# Patient Record
Sex: Female | Born: 1940 | Race: Black or African American | Hispanic: No | Marital: Single | Smoking: Never smoker
Health system: Southern US, Community
[De-identification: ages and names within clinical notes are randomized; demographics above are authoritative.]

## PROBLEM LIST (undated history)

## (undated) DIAGNOSIS — I10 Essential (primary) hypertension: Secondary | ICD-10-CM

---

## 2010-06-11 ENCOUNTER — Inpatient Hospital Stay (HOSPITAL_COMMUNITY): Admission: EM | Admit: 2010-06-11 | Discharge: 2010-06-13 | Payer: Self-pay | Admitting: Emergency Medicine

## 2010-06-11 ENCOUNTER — Ambulatory Visit: Payer: Self-pay | Admitting: Internal Medicine

## 2010-06-12 ENCOUNTER — Ambulatory Visit: Payer: Self-pay | Admitting: Vascular Surgery

## 2010-06-12 ENCOUNTER — Encounter (INDEPENDENT_AMBULATORY_CARE_PROVIDER_SITE_OTHER): Payer: Self-pay | Admitting: Internal Medicine

## 2011-01-01 LAB — URINE CULTURE

## 2011-01-01 LAB — CBC
Hemoglobin: 12.3 g/dL (ref 12.0–15.0)
RBC: 3.76 MIL/uL — ABNORMAL LOW (ref 3.87–5.11)
WBC: 5 10*3/uL (ref 4.0–10.5)

## 2011-01-01 LAB — COMPREHENSIVE METABOLIC PANEL
ALT: 12 U/L (ref 0–35)
AST: 20 U/L (ref 0–37)
Alkaline Phosphatase: 54 U/L (ref 39–117)
CO2: 26 mEq/L (ref 19–32)
Calcium: 8.8 mg/dL (ref 8.4–10.5)
Chloride: 110 mEq/L (ref 96–112)
GFR calc Af Amer: >60 mL/min (ref >60)
GFR calc non Af Amer: >60 mL/min (ref >60)
Glucose, Bld: 80 mg/dL (ref 70–99)
Total Bilirubin: 0.7 mg/dL (ref 0.3–1.2)

## 2011-01-01 LAB — POCT I-STAT, CHEM 8
BUN: 15 mg/dL (ref 6–23)
Chloride: 104 mEq/L (ref 96–112)
Creatinine, Ser: 0.9 mg/dL (ref 0.4–1.2)
Glucose, Bld: 103 mg/dL — ABNORMAL HIGH (ref 70–99)
Hemoglobin: 13.9 g/dL (ref 12.0–15.0)
Potassium: 4.6 mEq/L (ref 3.5–5.1)
Sodium: 142 mEq/L (ref 135–145)
TCO2: 31 mmol/L (ref 0–100)

## 2011-01-01 LAB — BASIC METABOLIC PANEL
CO2: 27 mEq/L (ref 19–32)
Glucose, Bld: 89 mg/dL (ref 70–99)
Potassium: 3.5 mEq/L (ref 3.5–5.1)
Sodium: 140 mEq/L (ref 135–145)

## 2011-01-01 LAB — URINALYSIS, ROUTINE W REFLEX MICROSCOPIC
Nitrite: NEGATIVE
Protein, ur: NEGATIVE mg/dL
Urobilinogen, UA: 0.2 mg/dL (ref 0.0–1.0)

## 2011-01-01 LAB — POCT CARDIAC MARKERS
Myoglobin, poc: 92.9 ng/mL (ref 12–200)
Troponin i, poc: <0.05 ng/mL (ref 0.00–0.09)

## 2011-01-01 LAB — LIPID PANEL
Cholesterol: 148 mg/dL (ref 0–200)
Total CHOL/HDL Ratio: 4 RATIO

## 2011-01-01 LAB — CK TOTAL AND CKMB (NOT AT ARMC)
CK, MB: 1.9 ng/mL (ref 0.3–4.0)
Total CK: 125 U/L (ref 7–177)

## 2011-01-01 LAB — PROTIME-INR: INR: 1.08 (ref 0.00–1.49)

## 2011-01-01 LAB — HEMOGLOBIN A1C: Mean Plasma Glucose: 114 mg/dL (ref <117)

## 2011-01-01 LAB — TROPONIN I: Troponin I: 0.01 ng/mL (ref 0.00–0.06)

## 2013-06-28 ENCOUNTER — Emergency Department (HOSPITAL_COMMUNITY): Payer: Medicare Other

## 2013-06-28 ENCOUNTER — Emergency Department (HOSPITAL_COMMUNITY)
Admission: EM | Admit: 2013-06-28 | Discharge: 2013-06-28 | Disposition: A | Discharge status: Home / Self Care | Payer: Medicare Other | Attending: Emergency Medicine | Admitting: Emergency Medicine

## 2013-06-28 ENCOUNTER — Encounter (HOSPITAL_COMMUNITY): Payer: Self-pay | Admitting: Emergency Medicine

## 2013-06-28 DIAGNOSIS — G8929 Other chronic pain: Secondary | ICD-10-CM | POA: Insufficient documentation

## 2013-06-28 DIAGNOSIS — S8991XA Unspecified injury of right lower leg, initial encounter: Secondary | ICD-10-CM

## 2013-06-28 DIAGNOSIS — I10 Essential (primary) hypertension: Secondary | ICD-10-CM | POA: Insufficient documentation

## 2013-06-28 DIAGNOSIS — X500XXA Overexertion from strenuous movement or load, initial encounter: Secondary | ICD-10-CM | POA: Insufficient documentation

## 2013-06-28 DIAGNOSIS — S8990XA Unspecified injury of unspecified lower leg, initial encounter: Secondary | ICD-10-CM | POA: Insufficient documentation

## 2013-06-28 DIAGNOSIS — Y92009 Unspecified place in unspecified non-institutional (private) residence as the place of occurrence of the external cause: Secondary | ICD-10-CM | POA: Insufficient documentation

## 2013-06-28 DIAGNOSIS — Z79899 Other long term (current) drug therapy: Secondary | ICD-10-CM | POA: Insufficient documentation

## 2013-06-28 DIAGNOSIS — Y9301 Activity, walking, marching and hiking: Secondary | ICD-10-CM | POA: Insufficient documentation

## 2013-06-28 HISTORY — DX: Essential (primary) hypertension: I10

## 2013-06-28 MED ORDER — HYDROCODONE-ACETAMINOPHEN 5-325 MG PO TABS
2.0000 | ORAL_TABLET | Freq: Once | ORAL | Status: AC
  Administered 2013-06-28: 2 via ORAL
  Filled 2013-06-28: qty 2

## 2013-06-28 MED ORDER — ONDANSETRON 8 MG PO TBDP: ORAL_TABLET | ORAL | Status: AC

## 2013-06-28 MED ORDER — ONDANSETRON 8 MG PO TBDP
8.0000 mg | ORAL_TABLET | Freq: Once | ORAL | Status: AC
  Administered 2013-06-28: 8 mg via ORAL
  Filled 2013-06-28: qty 1

## 2013-06-28 MED ORDER — PROMETHAZINE HCL 25 MG/ML IJ SOLN
12.5000 mg | Freq: Once | INTRAMUSCULAR | Status: DC
Start: 2013-06-28 — End: 2013-06-28
  Filled 2013-06-28: qty 1

## 2013-06-28 MED ORDER — IBUPROFEN 800 MG PO TABS: 800.0000 mg | ORAL_TABLET | Freq: Three times a day (TID) | ORAL | Status: AC

## 2013-06-28 MED ORDER — IBUPROFEN 800 MG PO TABS: 800.0000 mg | ORAL_TABLET | Freq: Once | ORAL | Status: DC

## 2013-06-28 MED ORDER — OXYCODONE-ACETAMINOPHEN 5-325 MG PO TABS: 1.0000 | ORAL_TABLET | ORAL | Status: DC | PRN

## 2013-06-28 NOTE — ED Notes (Signed)
Bed: WA18 Expected date:  Expected time:  Means of arrival:  Comments: EMS knee pain 

## 2013-06-28 NOTE — ED Notes (Signed)
Pt given crackers and apple sauce

## 2013-06-28 NOTE — ED Provider Notes (Signed)
CSN: [id number]     Arrival date & time 06/28/13  1514 History   First MD Initiated Contact with Patient 06/28/13 1526     Chief Complaint  Patient presents with  . Knee Pain   (Consider location/radiation/quality/duration/timing/severity/associated sxs/prior Treatment) HPI Comments: Michele Castro is a 72 y.o. female  with a hx of hypertension presents to the Emergency Department complaining of acute, persistent right knee pain beginning approximately one hour prior to arrival. Patient states she was walking down the steps at her daughter's house when she felt her right knee "pop" and she had severe 10 out of 10 pain at that time. She did not fall and was able to sit down but was unable to weight bear afterwards secondary to pain. She states a history of chronic pain in the right knee for several years with possible arthritis. She states she's received cortisone shots for this in the past but has not had one in the last year.  Patient reports swelling of the right knee mildly worse than at baseline. Resting her leg fully extended on the bed makes it better and flexion of the knee and ambulation in makes it worse.  Pt denies fever, chills, headache, neck pain chest pain abdominal pain, nausea, vomiting or weakness, dizziness, tingling, syncope.  Patient also denies fall, hitting her head, or loss of consciousness.  Patient does not take a blood thinner.  Patient is a 72 y.o. female presenting with knee pain. The history is provided by the patient and a relative. No language interpreter was used.  Knee Pain Location:  Knee Time since incident:  1 hour Injury: no   Knee location:  R knee Pain details:    Quality:  Aching and sharp   Radiates to:  Does not radiate   Severity:  Severe   Onset quality:  Sudden   Progression:  Unchanged Chronicity:  New Dislocation: no   Foreign body present:  No foreign bodies Prior injury to area:  No Relieved by:  None tried Worsened by:  Activity, bearing  weight and flexion Ineffective treatments:  None tried Associated symptoms: decreased ROM   Associated symptoms: no back pain, no fatigue, no fever, no itching, no muscle weakness, no neck pain, no numbness, no stiffness, no swelling and no tingling   Risk factors: no concern for non-accidental trauma, no frequent fractures, no known bone disorder, no obesity and no recent illness     Past Medical History  Diagnosis Date  . Hypertension    History reviewed. No pertinent past surgical history. History reviewed. No pertinent family history. History  Substance Use Topics  . Smoking status: Never Smoker   . Smokeless tobacco: Not on file  . Alcohol Use: No   OB History   Grav Para Term Preterm Abortions TAB SAB Ect Mult Living                 Review of Systems  Constitutional: Negative for fever and fatigue.  HENT: Negative for neck pain and neck stiffness.   Cardiovascular: Negative for chest pain.  Gastrointestinal: Negative for abdominal pain.  Musculoskeletal: Positive for joint swelling, arthralgias and gait problem. Negative for back pain and stiffness.  Skin: Negative for color change, itching and wound.  Allergic/Immunologic: Negative for immunocompromised state.  Neurological: Negative for weakness and numbness.  Hematological: Does not bruise/bleed easily.  Psychiatric/Behavioral: The patient is not nervous/anxious.   All other systems reviewed and are negative.    Allergies  Shellfish allergy and Sulfa  antibiotics  Home Medications   Current Outpatient Rx  Name  Route  Sig  Dispense  Refill  . amLODipine (NORVASC) 10 MG tablet   Oral   Take 10 mg by mouth daily.         . diphenhydrAMINE (BENADRYL) 25 mg capsule   Oral   Take 25 mg by mouth every 6 (six) hours as needed for itching or allergies.         . nabumetone (RELAFEN) 500 MG tablet   Oral   Take 500 mg by mouth 2 (two) times daily as needed for pain.         Marland Kitchen omeprazole (PRILOSEC) 20 MG  capsule   Oral   Take 20 mg by mouth 2 (two) times daily.         . ondansetron (ZOFRAN ODT) 8 MG disintegrating tablet      8mg  ODT q4 hours prn nausea   12 tablet   0   . oxyCODONE-acetaminophen (PERCOCET/ROXICET) 5-325 MG per tablet   Oral   Take 1 tablet by mouth every 4 (four) hours as needed for pain.   21 tablet   0   . triamterene-hydrochlorothiazide (MAXZIDE-25) 37.5-25 MG per tablet   Oral   Take 1 tablet by mouth daily.          BP 156/71  Pulse 92  Temp(Src) 97.8 F (36.6 C) (Oral)  Resp 16  SpO2 98% Physical Exam  Nursing note and vitals reviewed. Constitutional: She appears well-developed and well-nourished. No distress.  HENT:  Head: Normocephalic and atraumatic.  Eyes: Conjunctivae are normal.  Neck: Normal range of motion.  Cardiovascular: Normal rate, regular rhythm, S1 normal, S2 normal, normal heart sounds and intact distal pulses.   No murmur heard. Pulses:      Radial pulses are 2+ on the right side, and 2+ on the left side.       Dorsalis pedis pulses are 2+ on the right side, and 2+ on the left side.       Posterior tibial pulses are 2+ on the right side, and 2+ on the left side.  Capillary refill < 3 sec  Pulmonary/Chest: Effort normal and breath sounds normal. No respiratory distress. She has no wheezes.  Abdominal: Soft. She exhibits no distension.  Musculoskeletal: She exhibits tenderness. She exhibits no edema.       Right knee: She exhibits decreased range of motion. She exhibits no effusion, no ecchymosis, no deformity, no laceration, no erythema and normal patellar mobility. No patellar tendon tenderness noted.  ROM: Decreased range of motion to the right knee - patient is unable to flex knee at all secondary to pain Significant pain to palpation of the popliteal space  Lymphadenopathy:    She has no cervical adenopathy.  Neurological: She is alert. Coordination normal. GCS eye subscore is 4. GCS verbal subscore is 5. GCS motor  subscore is 6.  Sensation intact to bilateral lower extremities  Strength 5/5 in the right hip and right ankle, 1/5 in the right knee secondary to pain  Skin: Skin is warm and dry. She is not diaphoretic. No erythema.  No tenting of the skin NO erythema or increased warmth of the right knee  Psychiatric: She has a normal mood and affect.    ED Course  Procedures (including critical care time) Labs Review Labs Reviewed - No data to display Imaging Review Ct Knee Right Wo Contrast  06/28/2013   CLINICAL DATA:  Right knee pain following  an injury. No fracture seen on radiographs earlier today.  EXAM: CT OF THE RIGHT KNEE WITHOUT CONTRAST  TECHNIQUE: Multidetector CT imaging was performed according to the standard protocol. Multiplanar CT image reconstructions were also generated.  COMPARISON:  Right knee radiographs obtained earlier today.  FINDINGS: Tricompartmental spur formation. Moderate medial joint space narrowing. No fracture, dislocation or effusion.  IMPRESSION: 1. No fracture or effusion. 2. Tricompartmental degenerative changes.   Electronically Signed   By: Gordan Payment   On: 06/28/2013 17:48   Dg Knee Complete 4 Views Right  06/28/2013   *RADIOLOGY REPORT*  Clinical Data: Knee pain, injury  RIGHT KNEE - COMPLETE 4+ VIEW  Comparison: None.  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  No definite fracture, malalignment or effusion.  No definite soft tissue abnormality by plain radiography.  IMPRESSION: Mild arthritic changes.  No acute finding.   Original Report Authenticated By: Judie Petit. Miles Costain, M.D.    MDM   1. Right knee injury, initial encounter      [facility] presents with sudden onset right knee pain. Patient without evidence of erythema or induration to suggest cellulitis or septic joint.  Minimal swelling to the right knee when compared to the left. No evidence of joint effusion.  Patient is unable to bear weight and unable to flex  knee.  ruptures baker's cyst vs pathologic fracture vs less likely tendon rupture.  X-ray with mild arthritic changes but no acute findings.  I personally reviewed the imaging tests through PACS system.  I reviewed available ER/hospitalization records through the EMR.    Will obtain CT of the right knee for further evaluation.  Pain control with vicodin.  6:10 PM Pt pain controlled in the bed, but unable to flex knee or stand 2/2 pain at this time.  Pt placed in knee immobilizer and on crutches.  She is able to ambulate this way, but has intense nausea from the vicodin.  Will give phenergan in the department.   Patient X-Ray and CT negative for obvious fracture or dislocation. I personally reviewed the imaging tests through PACS system.  I reviewed available ER/hospitalization records through the EMR.  Pain managed in ED. Pt advised to follow up with orthopedics if symptoms persist. Patient given knee immobilizer brace while in ED, conservative therapy recommended and discussed. Patient will be dc home & is agreeable with above plan.  I have also discussed reasons to return immediately to the ER.  Patient expresses understanding and agrees with plan.       Dahlia Client Herb Beltre, PA-C 06/28/13 1947

## 2013-06-28 NOTE — ED Notes (Signed)
Pt was walking down steps at daughter's house when she felt R knee pop and had pain in back of R knee. Pt then lowered self to steps and scooted to bottom of stairs. Pt states when she tried to bear weight it hurt too much. Pt states R knee has hurt for past 6 months. Pain worse with lifting leg, able to bend knee slightly. No deformity noted.

## 2013-06-28 NOTE — ED Notes (Signed)
Patient is alert and oriented x3.  She was given DC instructions and follow up visit instructions.  Patient gave verbal understanding. She was DC ambulatory under her own power to home.  V/S stable.  He was not showing any signs of distress on DC 

## 2013-06-28 NOTE — ED Provider Notes (Signed)
Medical screening examination/treatment/procedure(s) were performed by non-physician practitioner and as supervising physician I was immediately available for consultation/collaboration.  Mareesa Gathright R. Kemo Spruce, MD 06/28/13 2341 

## 2014-02-16 IMAGING — CT CT KNEE*R* W/O CM
2 series · 10 of 14 positions shown, 12 images · non-contrast
Comparison: Right knee radiographs obtained earlier today.

CLINICAL DATA: Right knee pain following an injury. No fracture
seen on radiographs earlier today.

EXAM:
CT OF THE RIGHT KNEE WITHOUT CONTRAST
TECHNIQUE: Multidetector CT imaging was performed according to the standard
protocol. Multiplanar CT image reconstructions were also generated.

[Series 3: hip st · axial · 0.37mm/px · z∈[+802,+847]mm · 2 of 27 slices shown]
[im 9/27  bone]
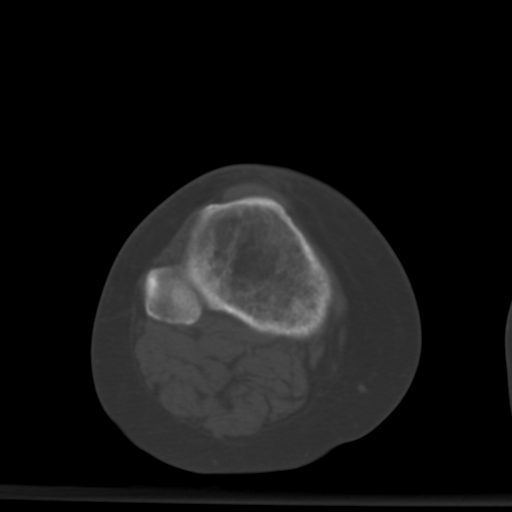
[im 18/27  bone]
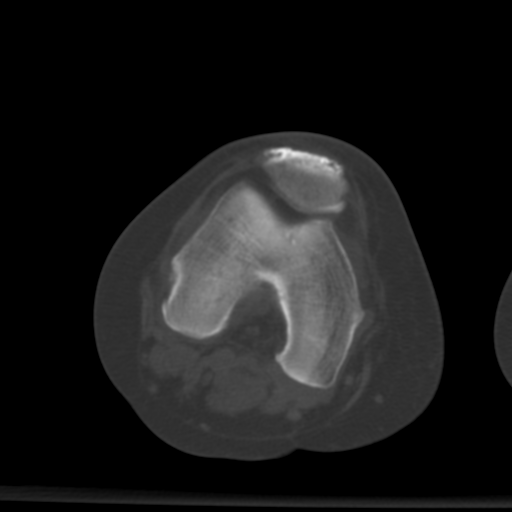

[Series 5: axial bone · axial · 0.32mm/px · z∈[+766,+868]mm · 8 of 67 slices shown, 10 images]
[im 8/67  soft-tissue]
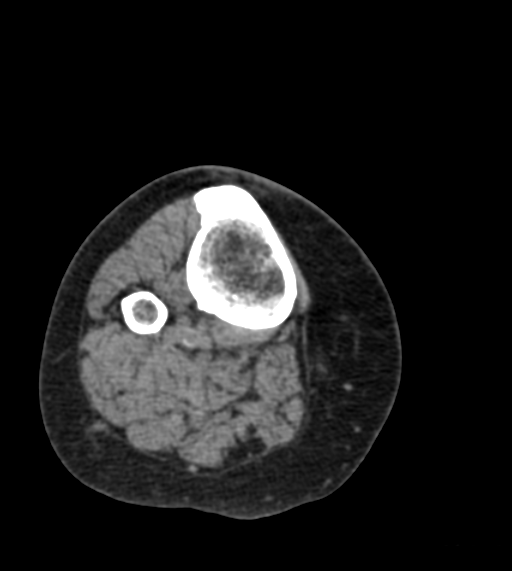
[im 8/67  bone]
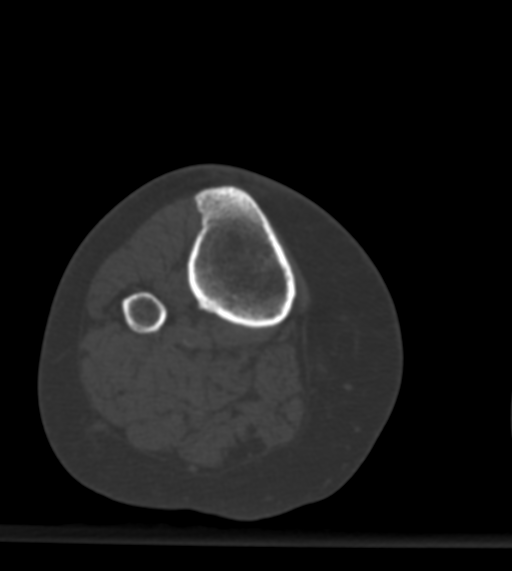
[im 15/67  bone]
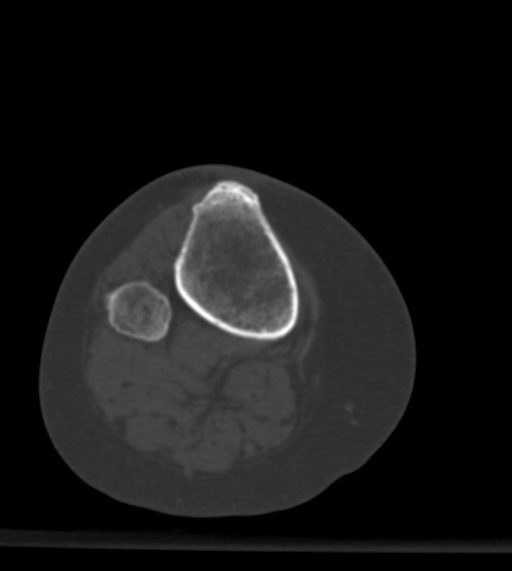
[im 23/67  bone]
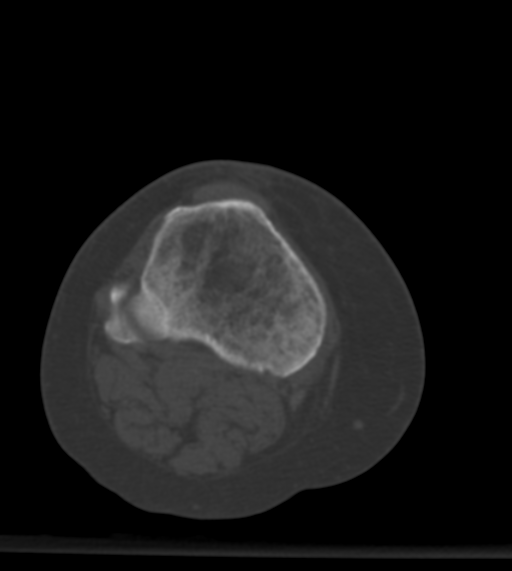
[im 30/67  bone]
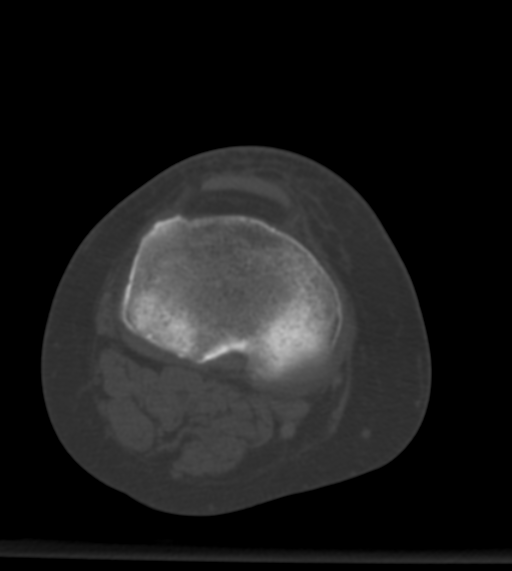
[im 37/67  soft-tissue]
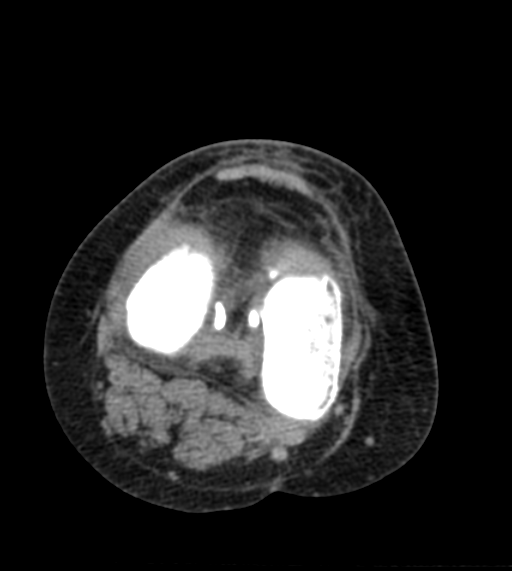
[im 37/67  bone]
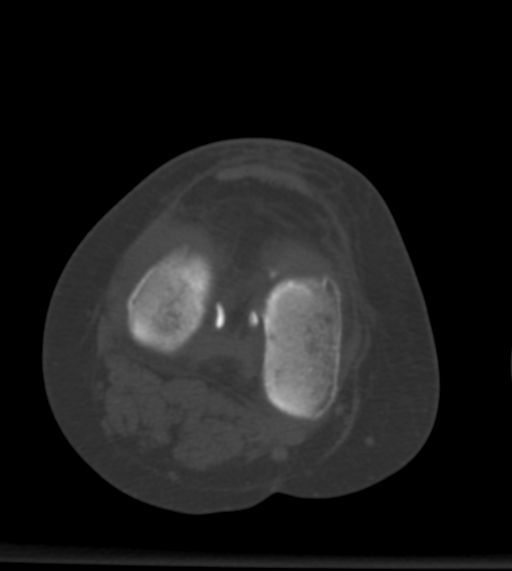
[im 45/67  bone]
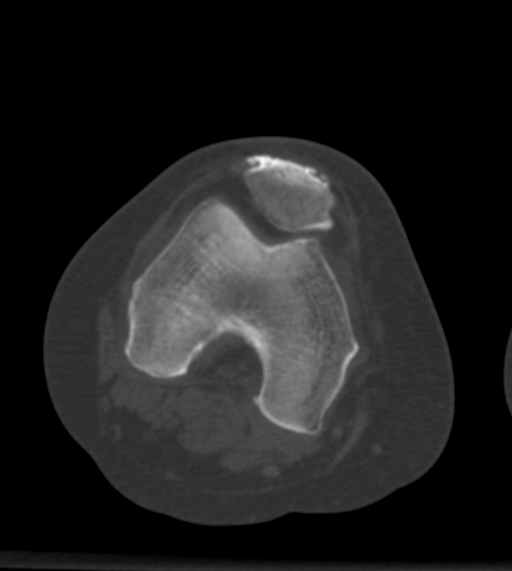
[im 52/67  bone]
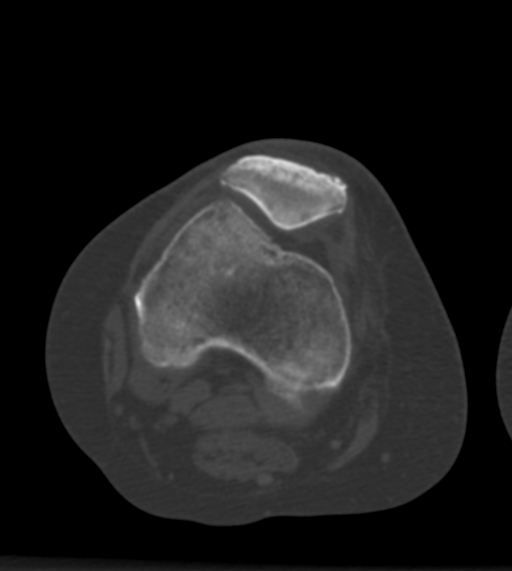
[im 59/67  bone]
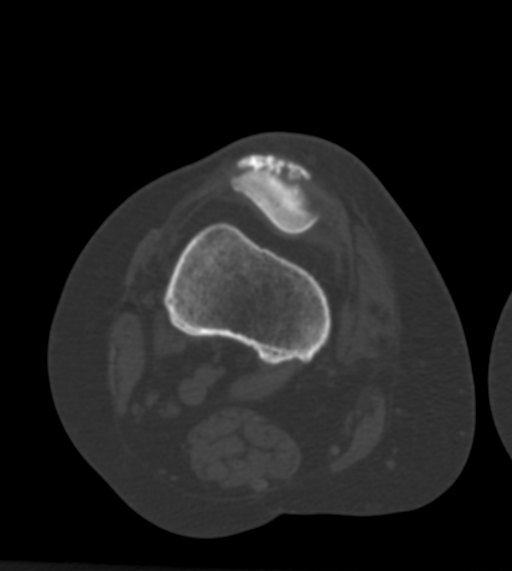

[10 of 14 positions shown; findings below may reference images not displayed]

FINDINGS: Tricompartmental spur formation. Moderate medial joint space
narrowing. No fracture, dislocation or effusion.
IMPRESSION: 1. No fracture or effusion.
2. Tricompartmental degenerative changes.

## 2019-06-24 ENCOUNTER — Emergency Department (HOSPITAL_COMMUNITY)
Admission: EM | Admit: 2019-06-24 | Discharge: 2019-06-24 | Disposition: A | Discharge status: Home / Self Care | Payer: Medicare HMO | Attending: Emergency Medicine | Admitting: Emergency Medicine

## 2019-06-24 ENCOUNTER — Encounter (HOSPITAL_COMMUNITY): Payer: Self-pay | Admitting: *Deleted

## 2019-06-24 ENCOUNTER — Other Ambulatory Visit: Payer: Self-pay

## 2019-06-24 DIAGNOSIS — Z79899 Other long term (current) drug therapy: Secondary | ICD-10-CM | POA: Insufficient documentation

## 2019-06-24 DIAGNOSIS — M25551 Pain in right hip: Secondary | ICD-10-CM | POA: Insufficient documentation

## 2019-06-24 MED ORDER — MELOXICAM 7.5 MG PO TABS: 7.5000 mg | ORAL_TABLET | Freq: Every day | ORAL | 0 refills | Status: DC

## 2019-06-24 MED ORDER — HYDROCODONE-ACETAMINOPHEN 5-325 MG PO TABS: 1.0000 | ORAL_TABLET | ORAL | 0 refills | Status: AC | PRN

## 2019-06-24 MED ORDER — HYDROCODONE-ACETAMINOPHEN 5-325 MG PO TABS
1.0000 | ORAL_TABLET | Freq: Once | ORAL | Status: AC
  Administered 2019-06-24: 11:00:00 1 via ORAL
  Filled 2019-06-24: qty 1

## 2019-06-24 MED ORDER — METHOCARBAMOL 500 MG PO TABS: 500.0000 mg | ORAL_TABLET | Freq: Two times a day (BID) | ORAL | 0 refills | Status: DC

## 2019-06-24 NOTE — ED Triage Notes (Signed)
Pt reports rt hip pain . Pt also reports she has been to a ortho. MD and rt hip is bone on bone. Pt also denies any falls or any type of injury to RT hip.

## 2019-06-24 NOTE — ED Notes (Signed)
Patient verbalizes understanding of discharge instructions . Opportunity for questions and answers were provided . Armband removed by staff ,Pt discharged from ED. W/C  offered at D/C  and Declined W/C at D/C and was escorted to lobby by RN.  

## 2019-06-24 NOTE — Discharge Instructions (Addendum)
Take Norco if needed for moderate to severe pain. Follow up with your orthopedist this week for recheck. Return to ER for new or worsening pain.

## 2019-06-24 NOTE — ED Triage Notes (Signed)
Family member reports Pt had tylenol alternating with advil 800 mg with out pain relief.

## 2019-06-24 NOTE — ED Provider Notes (Signed)
[facility] EMERGENCY DEPARTMENT Provider Note   CSN: [id number] Arrival date & time: 06/24/19  0850     History   Chief Complaint Chief Complaint  Patient presents with  . Hip Pain    Right    HPI Michele Castro is a 78 y.o. female.     78 year old female with past medical history of hypertension presents with complaint of pain in her right hip.  Patient has been seen by orthopedics in [facility] for pain in the right hip, is planning for right hip replacement however delayed due to scheduling demands.  Patient is not prescribed pain medication for her hip, states that she had severe pain in her hip last night, located anterior medially, not relieved with Motrin, Tylenol, muscle relaxer.  Pain has significantly improved as of this time and does not need anything for her pain currently.  Denies any falls or injuries.  No other complaints or concerns.     Past Medical History:  Diagnosis Date  . Hypertension     There are no active problems to display for this patient.   History reviewed. No pertinent surgical history.   OB History   No obstetric history on file.      Home Medications    Prior to Admission medications   Medication Sig Start Date End Date Taking? Authorizing Provider  amLODipine (NORVASC) 10 MG tablet Take 10 mg by mouth daily.    [provider]  diphenhydrAMINE (BENADRYL) 25 mg capsule Take 25 mg by mouth every 6 (six) hours as needed for itching or allergies.    [provider]  HYDROcodone-acetaminophen (NORCO/VICODIN) 5-325 MG tablet Take 1 tablet by mouth every 4 (four) hours as needed. 06/24/19   Tacy Learn, PA-C  ibuprofen (ADVIL,MOTRIN) 800 MG tablet Take 1 tablet (800 mg total) by mouth 3 (three) times daily. 06/28/13   Muthersbaugh, Jarrett Soho, PA-C  nabumetone (RELAFEN) 500 MG tablet Take 500 mg by mouth 2 (two) times daily as needed for pain.    [provider]  omeprazole (PRILOSEC) 20 MG capsule Take  20 mg by mouth 2 (two) times daily.    [provider]  ondansetron (ZOFRAN ODT) 8 MG disintegrating tablet 8mg  ODT q4 hours prn nausea 06/28/13   Muthersbaugh, Jarrett Soho, PA-C  triamterene-hydrochlorothiazide (MAXZIDE-25) 37.5-25 MG per tablet Take 1 tablet by mouth daily.    [provider]    Family History History reviewed. No pertinent family history.  Social History Social History   Tobacco Use  . Smoking status: Never Smoker  . Smokeless tobacco: Current User  Substance Use Topics  . Alcohol use: No  . Drug use: Not on file     Allergies   Shellfish allergy and Sulfa antibiotics   Review of Systems Review of Systems  Constitutional: Negative for fever.  Gastrointestinal: Negative for abdominal pain.  Musculoskeletal: Positive for arthralgias. Negative for back pain and joint swelling.  Skin: Negative for color change, rash and wound.  Allergic/Immunologic: Negative for immunocompromised state.  Neurological: Negative for weakness and numbness.  Hematological: Does not bruise/bleed easily.  All other systems reviewed and are negative.    Physical Exam Updated Vital Signs BP (!) 172/70   Pulse 65   Temp 98.3 F (36.8 C) (Oral) Comment: Simultaneous filing. User may not have seen previous data. Comment (Src): Simultaneous filing. User may not have seen previous data.  Resp 18 Comment: Simultaneous filing. User may not have seen previous data.  Ht 5\' 5"  (  1.651 m)   Wt 61.7 kg   SpO2 99%   BMI 22.63 kg/m   Physical Exam Vitals signs and nursing note reviewed.  Constitutional:      General: She is not in acute distress.    Appearance: She is well-developed. She is not diaphoretic.  HENT:     Head: Normocephalic and atraumatic.  Cardiovascular:     Pulses: Normal pulses.  Pulmonary:     Effort: Pulmonary effort is normal.  Abdominal:     Palpations: Abdomen is soft.     Tenderness: There is no abdominal tenderness.  Musculoskeletal:         General: No swelling or tenderness.       Legs:  Skin:    General: Skin is warm and dry.     Findings: No erythema or rash.  Neurological:     Mental Status: She is alert and oriented to person, place, and time.     Motor: No weakness.  Psychiatric:        Behavior: Behavior normal.      ED Treatments / Results  Labs (all labs ordered are listed, but only abnormal results are displayed) Labs Reviewed - No data to display  EKG None  Radiology No results found.  Procedures Procedures (including critical care time)  Medications Ordered in ED Medications  HYDROcodone-acetaminophen (NORCO/VICODIN) 5-325 MG per tablet 1 tablet (1 tablet Oral Given 06/24/19 1041)     Initial Impression / Assessment and Plan / ED Course  I have reviewed the triage vital signs and the nursing notes.  Pertinent labs & imaging results that were available during my care of the patient were reviewed by me and considered in my medical decision making (see chart for details).  Clinical Course as of Jun 23 1058  Wynelle LinkSun Jun 24, 2019  1056 78yo female with right hip pain, no history of injury, awaiting right hip replacement. Patient reports severe pain last night, took IBU, APAP, muscle relaxor without relief. Pain has improved as of now. On exam, DP pulses present, no pain with log roll hip, does have pain with abduction, skin normal, no swelling. Patient seen by Dr. Jeraldine LootsLockwood, ER attending, plan is to dc with Norco, follow up with ortho.   [LM]  1058 Correction to ed dc orders- patient is already on meloxicam and robaxin, these meds were NOT represcribed today.   [LM]    Clinical Course User Index [LM] Jeannie FendMurphy, Denzil Mceachron A, PA-C      Final Clinical Impressions(s) / ED Diagnoses   Final diagnoses:  Right hip pain    ED Discharge Orders         Ordered    meloxicam (MOBIC) 7.5 MG tablet  Daily,   Status:  Discontinued     06/24/19 1018    methocarbamol (ROBAXIN) 500 MG tablet  2 times daily,    Status:  Discontinued     06/24/19 1018    HYDROcodone-acetaminophen (NORCO/VICODIN) 5-325 MG tablet  Every 4 hours PRN     06/24/19 1036           Jeannie FendMurphy, Angelyse Heslin A, PA-C 06/24/19 1059    Gerhard MunchLockwood, Robert, MD 06/25/19 [phone]

## 2024-11-18 DEATH — deceased
